# Patient Record
Sex: Male | Born: 1970 | Hispanic: No | Marital: Single | Smoking: Current some day smoker
Health system: Southern US, Community
[De-identification: ages and names within clinical notes are randomized; demographics above are authoritative.]

## PROBLEM LIST (undated history)

## (undated) DIAGNOSIS — I10 Essential (primary) hypertension: Secondary | ICD-10-CM

---

## 2018-09-09 ENCOUNTER — Emergency Department (HOSPITAL_COMMUNITY)
Admission: EM | Admit: 2018-09-09 | Discharge: 2018-09-09 | Disposition: A | Discharge status: Home / Self Care | Payer: Self-pay | Attending: Emergency Medicine | Admitting: Emergency Medicine

## 2018-09-09 ENCOUNTER — Encounter (HOSPITAL_COMMUNITY): Payer: Self-pay

## 2018-09-09 ENCOUNTER — Emergency Department (HOSPITAL_COMMUNITY): Payer: Self-pay

## 2018-09-09 DIAGNOSIS — R0789 Other chest pain: Secondary | ICD-10-CM | POA: Insufficient documentation

## 2018-09-09 DIAGNOSIS — J069 Acute upper respiratory infection, unspecified: Secondary | ICD-10-CM | POA: Insufficient documentation

## 2018-09-09 DIAGNOSIS — B9789 Other viral agents as the cause of diseases classified elsewhere: Secondary | ICD-10-CM | POA: Insufficient documentation

## 2018-09-09 NOTE — ED Notes (Signed)
To x-ray

## 2018-09-09 NOTE — ED Triage Notes (Signed)
Onset several days cough.  Left side of chest painful when takes deep breath.  Also, has nasal congestion.  NAD at triage.

## 2018-09-09 NOTE — ED Notes (Signed)
The pts chest is hurting just started this am  He only has pain when he takes a deep breath no pain at present no distress

## 2018-09-09 NOTE — Discharge Instructions (Signed)
Your symptoms are likely caused by a viral upper respiratory infection. Antibiotics are not helpful in treating viral infection, the virus should run its course in about 5-7 days. Please make sure you are drinking plenty of fluids. You can treat your symptoms supportively with tylenol/ibuprofen for fevers and pains, Zyrtec and Flonase to help with nasal congestion, and over the counter cough syrups and throat lozenges to help with cough. If your symptoms are not improving please follow up with you Primary doctor.   If you develop persistent fevers, shortness of breath or difficulty breathing, chest pain, severe headache and neck pain, persistent nausea and vomiting or other new or concerning symptoms return to the Emergency department.  

## 2018-09-09 NOTE — ED Provider Notes (Signed)
[facility] EMERGENCY [facility] Provider Note   CSN: [id number] Arrival date & time: 09/09/18  1623     History   Chief Complaint Chief Complaint  Patient presents with  . Cough    HPI Jesus Thomas is a 48 y.o. male.  Jesus Thomas is a 48 y.o. male who is otherwise healthy, presents to the emergency department for 3 to 4 days of cough.  He reports cough is occasionally productive of sputum.  He has not had any fevers or chills.  Reports some associated nasal congestion.  No sore throat or ear pain.  Reports this morning he had a brief episode of pain over the left side of his chest along the costochondral margin whenever he took a deep breath.  He reports this pain seemed to resolve on its own and has not recurred since then.  Patient currently chest pain-free reports occasional cough.  He denies any associated shortness of breath. Denies lower extremity pain or swelling, recent travel or immobilization, history of PE or DVT, family or personal history of bleeding or clotting disorders, or hemoptysis.  He took some Aleve and has used a nasal spray intermittently for his symptoms has not tried anything else to treat the symptoms.  No other aggravating or alleviating factors.     History reviewed. No pertinent past medical history.  There are no active problems to display for this patient.   History reviewed. No pertinent surgical history.      Home Medications    Prior to Admission medications   Not on File    Family History History reviewed. No pertinent family history.  Social History Social History   Tobacco Use  . Smoking status: Not on file  Substance Use Topics  . Alcohol use: Not on file  . Drug use: Not on file     Allergies   Patient has no known allergies.   Review of Systems Review of Systems  Constitutional: Negative for chills and fever.  HENT: Positive for congestion, postnasal drip and rhinorrhea. Negative for ear pain  and sore throat.   Eyes: Negative for visual disturbance.  Respiratory: Positive for cough. Negative for chest tightness, shortness of breath and wheezing.   Cardiovascular: Positive for chest pain. Negative for palpitations and leg swelling.  Gastrointestinal: Negative for abdominal pain, nausea and vomiting.  Genitourinary: Negative for dysuria, flank pain and frequency.  Musculoskeletal: Negative for arthralgias and myalgias.  Skin: Negative for color change and rash.  Neurological: Negative for dizziness, syncope and light-headedness.     Physical Exam Updated Vital Signs BP (!) 145/97 (BP Location: Left Arm)   Pulse 74   Temp 98.1 F (36.7 C) (Oral)   Resp 16   SpO2 95%   Physical Exam Vitals signs and nursing note reviewed.  Constitutional:      General: He is not in acute distress.    Appearance: He is well-developed. He is not ill-appearing or diaphoretic.  HENT:     Head: Normocephalic and atraumatic.     Nose: Congestion and rhinorrhea present.     Mouth/Throat:     Mouth: Mucous membranes are moist.     Pharynx: Oropharynx is clear. Posterior oropharyngeal erythema present. No oropharyngeal exudate.  Eyes:     General:        Right eye: No discharge.        Left eye: No discharge.  Neck:     Musculoskeletal: Neck supple.     Comments: No rigidity  Cardiovascular:     Rate and Rhythm: Normal rate and regular rhythm.     Pulses: Normal pulses.     Heart sounds: Normal heart sounds. No murmur. No friction rub. No gallop.   Pulmonary:     Effort: Pulmonary effort is normal. No respiratory distress.     Breath sounds: Normal breath sounds.     Comments: Respirations equal and unlabored, patient able to speak in full sentences, lungs clear to auscultation bilaterally, chest nontender to palpation Chest:     Chest wall: No tenderness.  Abdominal:     General: Abdomen is flat. Bowel sounds are normal. There is no distension.     Palpations: Abdomen is soft. There  is no mass.     Tenderness: There is no abdominal tenderness. There is no guarding.     Comments: Abdomen soft, nondistended, nontender to palpation in all quadrants without guarding or peritoneal signs  Musculoskeletal:        General: No deformity.     Right lower leg: No edema.     Left lower leg: No edema.  Lymphadenopathy:     Cervical: No cervical adenopathy.  Skin:    General: Skin is warm and dry.     Capillary Refill: Capillary refill takes less than 2 seconds.  Neurological:     Mental Status: He is alert and oriented to person, place, and time. Mental status is at baseline.     Coordination: Coordination normal.  Psychiatric:        Mood and Affect: Mood normal.        Behavior: Behavior normal.      ED Treatments / Results  Labs (all labs ordered are listed, but only abnormal results are displayed) Labs Reviewed - No data to display  EKG None  Radiology Dg Chest 2 View  Result Date: 09/09/2018 CLINICAL DATA:  Pleuritic left chest pain. Productive cough and sore throat. Symptoms started this morning. EXAM: CHEST - 2 VIEW COMPARISON:  None. FINDINGS: The heart size and mediastinal contours are within normal limits. Both lungs are clear. The visualized skeletal structures are unremarkable. IMPRESSION: No active cardiopulmonary disease. Electronically Signed   By: Gaylyn Rong M.D.   On: 09/09/2018 20:29    Procedures Procedures (including critical care time)  Medications Ordered in ED Medications - No data to display   Initial Impression / Assessment and Plan / ED Course  I have reviewed the triage vital signs and the nursing notes.  Pertinent labs & imaging results that were available during my care of the patient were reviewed by me and considered in my medical decision making (see chart for details).  Pt presents with nasal congestion and cough, had brief episode of pain over the left costochondral margin this morning with deep breathing which resolved  and patient has had no recurrence of symptoms.  Patient is PERC negative and has no PE risk factors.  Pt is well appearing and vitals are normal. Lungs CTA on exam. Pt CXR negative for acute infiltrate.  No chest tenderness on exam.  Patients symptoms are consistent with URI, likely viral etiology. Discussed that antibiotics are not indicated for viral infections. Pt will be discharged with symptomatic treatment.  Verbalizes understanding and is agreeable with plan. Pt is hemodynamically stable & in NAD prior to dc. Return precautions discussed, pt expresses understanding and agrees with plan.   Final Clinical Impressions(s) / ED Diagnoses   Final diagnoses:  Viral URI with cough  Chest wall pain  ED Discharge Orders    None       Legrand RamsFord, Edan Serratore N, PA-C 09/09/18 2112    Maia PlanLong, Joshua G, MD 09/10/18 [phone]

## 2018-11-26 ENCOUNTER — Encounter (HOSPITAL_COMMUNITY): Payer: Self-pay | Admitting: Emergency Medicine

## 2018-11-26 ENCOUNTER — Emergency Department (HOSPITAL_COMMUNITY)
Admission: EM | Admit: 2018-11-26 | Discharge: 2018-11-26 | Disposition: A | Discharge status: Home / Self Care | Payer: Self-pay | Attending: Emergency Medicine | Admitting: Emergency Medicine

## 2018-11-26 ENCOUNTER — Other Ambulatory Visit: Payer: Self-pay

## 2018-11-26 DIAGNOSIS — H00014 Hordeolum externum left upper eyelid: Secondary | ICD-10-CM | POA: Insufficient documentation

## 2018-11-26 DIAGNOSIS — F17200 Nicotine dependence, unspecified, uncomplicated: Secondary | ICD-10-CM | POA: Insufficient documentation

## 2018-11-26 DIAGNOSIS — I10 Essential (primary) hypertension: Secondary | ICD-10-CM | POA: Insufficient documentation

## 2018-11-26 HISTORY — DX: Essential (primary) hypertension: I10

## 2018-11-26 NOTE — ED Provider Notes (Signed)
[facility] EMERGENCY DEPARTMENT Provider Note   CSN: [id number] Arrival date & time: 11/26/18  0631    History   Chief Complaint Chief Complaint  Patient presents with  . Eye Pain    HPI Jesus Thomas is a 48 y.o. male.     HPI   Pt is a 48 y/o male with a h/o HTN who presents to the ED today c/o eye irritation that began 6 days ago. States his upper eyelid became somewhat swollen, red, and irritated a few days ago. HE has had some eye drainage intermittently since this started. No significant eye pain. No vision changes or headaches. Does not wear contacts. Has tried washing eye with soap and water without relief.   Past Medical History:  Diagnosis Date  . Hypertension     There are no active problems to display for this patient.   History reviewed. No pertinent surgical history.      Home Medications    Prior to Admission medications   Not on File    Family History History reviewed. No pertinent family history.  Social History Social History   Tobacco Use  . Smoking status: Current Some Day Smoker  . Smokeless tobacco: Never Used  Substance Use Topics  . Alcohol use: Not on file  . Drug use: Not on file     Allergies   Patient has no known allergies.   Review of Systems Review of Systems  Constitutional: Negative for fever.  Eyes: Positive for discharge. Negative for photophobia, redness and itching.       Redness/irritation to the left upper eyelid  Gastrointestinal: Negative for nausea and vomiting.  Musculoskeletal: Negative for back pain.  Neurological: Negative for headaches.     Physical Exam Updated Vital Signs BP (!) 166/107 (BP Location: Left Arm)   Pulse 66   Temp (!) 97.4 F (36.3 C) (Oral)   Resp 16   Ht 6' (1.829 m)   Wt 102.1 kg   SpO2 99%   BMI 30.52 kg/m   Physical Exam Constitutional:      General: He is not in acute distress.    Appearance: He is well-developed.  HENT:     Nose: Nose  normal.  Eyes:     General:        Right eye: No discharge.        Left eye: No discharge.     Extraocular Movements: Extraocular movements intact.     Conjunctiva/sclera: Conjunctivae normal.     Pupils: Pupils are equal, round, and reactive to light.     Comments: No pain with EOM. Small amount of erythema and swelling to the left upper eyelid with hordeolum noted to medial aspect of upper eyelid. No swelling/erythema to lower lid. No induration of the eyelid.   Cardiovascular:     Rate and Rhythm: Normal rate.  Pulmonary:     Effort: Pulmonary effort is normal.  Skin:    General: Skin is warm and dry.  Neurological:     Mental Status: He is alert and oriented to person, place, and time.     ED Treatments / Results  Labs (all labs ordered are listed, but only abnormal results are displayed) Labs Reviewed - No data to display  EKG None  Radiology No results found.  Procedures Procedures (including critical care time)  Medications Ordered in ED Medications - No data to display   Initial Impression / Assessment and Plan / ED Course  I have  reviewed the triage vital signs and the nursing notes.  Pertinent labs & imaging results that were available during my care of the patient were reviewed by me and considered in my medical decision making (see chart for details).      Final Clinical Impressions(s) / ED Diagnoses   Final diagnoses:  Hordeolum externum of left upper eyelid   Physical exam with left upper lid erythema, tenderness and mild swelling without injection of the cornea or conjunctiva, consistent with external hordeolum.  No purulent discharge, entrapment, consensual photophobia, or dendritic staining with fluorescein study to either eye.  Doubt corneal abrasion as no discomfort to the eye itself. No induration of the eyelid to suggest periorbital cellulitis. Presentation non-concerning for iritis, bacterial conjunctivitis, corneal abrasions, or HSV.     discussed use of warm compresses.   Patient advised to followup with ophthalmologist if symptoms persist or worsen in any way including vision change or purulent discharge.    It has been determined that no acute conditions requiring further emergency intervention are present at this time. The patient/guardian have been advised of the diagnosis and plan. We have discussed signs and symptoms that warrant return to the ED, such as changes or worsening in symptoms.   Vital signs are stable at discharge.   BP (!) 166/107 (BP Location: Left Arm)   Pulse 66   Temp (!) 97.4 F (36.3 C) (Oral)   Resp 16   Ht 6' (1.829 m)   Wt 102.1 kg   SpO2 99%   BMI 30.52 kg/m   Patient/guardian has voiced understanding and agreed to follow-up with the PCP or specialist.  ED Discharge Orders    None       Karrie Meres, PA-C 11/26/18 5498    Geoffery Lyons, MD 11/26/18 2317

## 2018-11-26 NOTE — ED Notes (Signed)
ED Provider at bedside. 

## 2018-11-26 NOTE — ED Notes (Signed)
Patient verbalizes understanding of discharge instructions . Opportunity for questions and answers were provided . Armband removed by staff ,Pt discharged from ED. W/C  offered at D/C  and Declined W/C at D/C and was escorted to lobby by RN.  

## 2018-11-26 NOTE — ED Triage Notes (Signed)
Pt c/o eye infection with swelling and purulent drainage, beginning earlier this week. Pt denies changes in vision. Hasn't taken anything for pain or infection.

## 2018-11-26 NOTE — Discharge Instructions (Addendum)
Please use warm compresses on the eye multiple times per day.   Monitor your symptoms closely. If you experience worsening pain or swelling to the eye, fevers, vision changes, or any new or worsening symptoms then you need to return tot he emergency room immediately.

## 2019-05-22 ENCOUNTER — Encounter (HOSPITAL_COMMUNITY): Payer: Self-pay | Admitting: *Deleted

## 2019-05-22 ENCOUNTER — Emergency Department (HOSPITAL_COMMUNITY): Payer: Self-pay

## 2019-05-22 ENCOUNTER — Emergency Department (HOSPITAL_COMMUNITY)
Admission: EM | Admit: 2019-05-22 | Discharge: 2019-05-23 | Disposition: A | Discharge status: Home / Self Care | Payer: Self-pay | Attending: Emergency Medicine | Admitting: Emergency Medicine

## 2019-05-22 DIAGNOSIS — F17210 Nicotine dependence, cigarettes, uncomplicated: Secondary | ICD-10-CM | POA: Insufficient documentation

## 2019-05-22 DIAGNOSIS — K0889 Other specified disorders of teeth and supporting structures: Secondary | ICD-10-CM | POA: Insufficient documentation

## 2019-05-22 DIAGNOSIS — R059 Cough, unspecified: Secondary | ICD-10-CM

## 2019-05-22 DIAGNOSIS — R05 Cough: Secondary | ICD-10-CM | POA: Insufficient documentation

## 2019-05-22 DIAGNOSIS — I10 Essential (primary) hypertension: Secondary | ICD-10-CM | POA: Insufficient documentation

## 2019-05-22 LAB — BASIC METABOLIC PANEL
Anion gap: 14 (ref 5–15)
BUN: 9 mg/dL (ref 6–20)
CO2: 20 mmol/L — ABNORMAL LOW (ref 22–32)
Calcium: 8.9 mg/dL (ref 8.9–10.3)
Chloride: 102 mmol/L (ref 98–111)
Creatinine, Ser: 1.12 mg/dL (ref 0.61–1.24)
GFR calc Af Amer: >60 mL/min (ref >60)
GFR calc non Af Amer: >60 mL/min (ref >60)
Glucose, Bld: 168 mg/dL — ABNORMAL HIGH (ref 70–99)
Potassium: 3.8 mmol/L (ref 3.5–5.1)
Sodium: 136 mmol/L (ref 135–145)

## 2019-05-22 LAB — CBC
HCT: 46.9 % (ref 39.0–52.0)
Hemoglobin: 15.1 g/dL (ref 13.0–17.0)
MCH: 28.4 pg (ref 26.0–34.0)
MCHC: 32.2 g/dL (ref 30.0–36.0)
MCV: 88.2 fL (ref 80.0–100.0)
Platelets: 419 10*3/uL — ABNORMAL HIGH (ref 150–400)
RBC: 5.32 MIL/uL (ref 4.22–5.81)
RDW: 14.4 % (ref 11.5–15.5)
WBC: 10 10*3/uL (ref 4.0–10.5)
nRBC: 0 % (ref 0.0–0.2)

## 2019-05-22 LAB — TROPONIN I (HIGH SENSITIVITY)
Troponin I (High Sensitivity): 9 ng/L (ref <18)
Troponin I (High Sensitivity): 9 ng/L (ref <18)

## 2019-05-22 MED ORDER — SODIUM CHLORIDE 0.9% FLUSH: 3.0000 mL | Freq: Once | INTRAVENOUS | Status: DC

## 2019-05-22 NOTE — ED Triage Notes (Signed)
Pt in c/o mid cp that radiates to mid back with coughing only, reports SOB earlier today that since has subsided, c/o dizziness onset at 12 pm today, denies n/v/d, A&O x4

## 2019-05-22 NOTE — ED Notes (Signed)
Pt stated he is feeling very dizzy. Even while sitting or laying down he is feeling dizzy. Pt currently laying down on waiting room bench

## 2019-05-23 ENCOUNTER — Other Ambulatory Visit: Payer: Self-pay

## 2019-05-23 MED ORDER — BENZONATATE 100 MG PO CAPS: 100.0000 mg | ORAL_CAPSULE | Freq: Three times a day (TID) | ORAL | 0 refills | Status: AC

## 2019-05-23 MED ORDER — LISINOPRIL 10 MG PO TABS: 10.0000 mg | ORAL_TABLET | Freq: Every day | ORAL | 1 refills | Status: AC

## 2019-05-23 MED ORDER — LIDOCAINE VISCOUS HCL 2 % MT SOLN: 15.0000 mL | OROMUCOSAL | 2 refills | Status: AC | PRN

## 2019-05-23 MED ORDER — PREDNISONE 10 MG (21) PO TBPK: ORAL_TABLET | ORAL | 0 refills | Status: AC

## 2019-05-23 NOTE — Discharge Instructions (Addendum)
Overall, your work-up here is reassuring.  There were some nonspecific T wave abnormalities on the EKG.  Follow-up with cardiology or a primary care provider on this matter. Begin taking the lisinopril again.  You will need to follow-up with a primary care provider for any further management of your blood pressure.  Tessalon: This medication may be used, as needed, for cough.  Prednisone: Take the prednisone, as prescribed, until finished. If you are a diabetic, please know prednisone can raise your blood sugar temporarily.   Dental Pain You have been seen today for dental pain. You should follow up with a dentist as soon as possible. This problem will not resolve on its own without the care of a dentist.  Lidocaine liquid: Use the viscous lidocaine for mouth pain. Swish with the lidocaine and spit it out. Do not swallow it. Salt water solution: You should also swish with a homemade salt water solution, twice a day.  Make this solution by mixing 8 ounces of warm water with about half a teaspoon of salt. Antiinflammatory medications: Take 600 mg of ibuprofen every 6 hours or 440 mg (over the counter dose) to 500 mg (prescription dose) of naproxen every 12 hours for the next 3 days. After this time, these medications may be used as needed for pain. Take these medications with food to avoid upset stomach. Choose only one of these medications, do not take them together. Acetaminophen (generic for Tylenol): Should you continue to have additional pain while taking the ibuprofen or naproxen, you may add in acetaminophen as needed. Your daily total maximum amount of acetaminophen from all sources should be limited to 4000mg /day for persons without liver problems, or 2000mg /day for those with liver problems.  For prescription assistance, may try using prescription discount sites or apps, such as goodrx.[place], [address] [place], [place] [address] [phone]  [place], [place] [address] [phone]  [facility] [address] [place], [place] [address] [phone] ext. [place] [address], [place] [address] [place], [place] [address] [phone]  [facility] [address] [place], [place] [address] [phone]  [facility] [employer] [email or web address]  [place] [address] [place], [place] [address] [phone]  Website for free, low-income, or sliding scale dental services in : www.freedentalcare.[place]  To find a dentist in [place] and surrounding areas: CardCollectible.com.ee  [facility] [email or web address]  [facility] Medicaid Dentist [email or web address]

## 2019-05-23 NOTE — ED Provider Notes (Signed)
[facility] EMERGENCY DEPARTMENT Provider Note   CSN: [id number] Arrival date & time: 05/22/19  1337     History   Chief Complaint Chief Complaint  Patient presents with  . Chest Pain    HPI Jesus Thomas is a 48 y.o. male.     HPI   Jesus Thomas is a 48 y.o. male, with a history of HTN, presenting to the ED with chest pain.  Patient states he has had instances of central chest soreness and sharp pain with coughing, sometimes also present in the upper back with coughing, moderate, nonradiating from these locations.  This is been occurring intermittently for about the past week.  He had an instance of shortness of breath this morning, but this resolved and did not recur. Patient denies contact with known or suspected COVID-19 patients. Also complains of some right lower dental pain.  States he broke a tooth in this region several years ago and has had increased sensitivity and soreness over the past several months.  Denies fever, facial swelling, vomiting, throat pain/swelling.  Denies diaphoresis, exertional chest pain, exertional shortness of breath, abdominal pain, or any other complaints.   Past Medical History:  Diagnosis Date  . Hypertension     There are no active problems to display for this patient.   History reviewed. No pertinent surgical history.      Home Medications    Prior to Admission medications   Medication Sig Start Date End Date Taking? Authorizing Provider  benzonatate (TESSALON) 100 MG capsule Take 1 capsule (100 mg total) by mouth every 8 (eight) hours. 05/23/19   Antigone Crowell C, PA-C  lidocaine (XYLOCAINE) 2 % solution Use as directed 15 mLs in the mouth or throat as needed for mouth pain. 05/23/19   Aracelys Glade C, PA-C  lisinopril (ZESTRIL) 10 MG tablet Take 1 tablet (10 mg total) by mouth daily. 05/23/19 07/22/19  Raesha Coonrod C, PA-C  predniSONE (STERAPRED UNI-PAK 21 TAB) 10 MG (21) TBPK tablet Take 6 tabs (60mg ) day 1, 5 tabs  (50mg ) day 2, 4 tabs (40mg ) day 3, 3 tabs (30mg ) day 4, 2 tabs (20mg ) day 5, and 1 tab (10mg ) day 6. 05/23/19   Camiah Humm, Helane Gunther, PA-C    Family History No family history on file.  Social History Social History   Tobacco Use  . Smoking status: Current Some Day Smoker    Types: Cigarettes  . Smokeless tobacco: Never Used  Substance Use Topics  . Alcohol use: Not Currently  . Drug use: Yes    Types: Marijuana     Allergies   Patient has no known allergies.   Review of Systems Review of Systems  Constitutional: Negative for chills, diaphoresis and fever.  HENT: Positive for dental problem. Negative for drooling, facial swelling, trouble swallowing and voice change.   Respiratory: Positive for cough and shortness of breath (resolved).   Cardiovascular: Positive for chest pain (with coughing).  Gastrointestinal: Negative for abdominal pain, diarrhea, nausea and vomiting.  Neurological: Negative for dizziness, syncope, weakness, numbness and headaches.  All other systems reviewed and are negative.    Physical Exam Updated Vital Signs BP (!) 185/115 (BP Location: Right Arm)   Pulse 77   Temp 97.8 F (36.6 C) (Oral)   Resp 16   SpO2 98%   Physical Exam Vitals signs and nursing note reviewed.  Constitutional:      General: He is not in acute distress.    Appearance: He is well-developed. He  is not diaphoretic.  HENT:     Head: Normocephalic and atraumatic.     Mouth/Throat:     Mouth: Mucous membranes are moist.     Pharynx: Oropharynx is clear.     Comments: Erosion to two right rearmost mandibular molars down to the gumline.  No noted pulp exposure.  Minimal tenderness. Dentition appears to be stable.  No noted area of swelling or fluctuance.  No trismus or noted abnormal phonation.  Mouth opening to at least 3 finger widths.  Handles oral secretions without difficulty.  No noted facial swelling.  No sublingual swelling.  No swelling or tenderness to the submental or  submandibular regions.  No swelling or tenderness into the soft tissues of the neck. Eyes:     Conjunctiva/sclera: Conjunctivae normal.  Neck:     Musculoskeletal: Neck supple.  Cardiovascular:     Rate and Rhythm: Normal rate and regular rhythm.     Pulses: Normal pulses.          Radial pulses are 2+ on the right side and 2+ on the left side.       Posterior tibial pulses are 2+ on the right side and 2+ on the left side.     Heart sounds: Normal heart sounds.     Comments: Tactile temperature in the extremities appropriate and equal bilaterally. Pulmonary:     Effort: Pulmonary effort is normal. No respiratory distress.     Breath sounds: Normal breath sounds.  Chest:     Chest wall: No tenderness.  Abdominal:     Palpations: Abdomen is soft.     Tenderness: There is no abdominal tenderness. There is no guarding.  Musculoskeletal:     Right lower leg: No edema.     Left lower leg: No edema.  Lymphadenopathy:     Cervical: No cervical adenopathy.  Skin:    General: Skin is warm and dry.  Neurological:     Mental Status: He is alert.     Comments: Sensation grossly intact to light touch in the extremities.  Grip strengths equal bilaterally.  Strength 5/5 in all extremities. No gait disturbance. Coordination intact. Cranial nerves III-XII grossly intact. No facial droop.   Psychiatric:        Mood and Affect: Mood and affect normal.        Speech: Speech normal.        Behavior: Behavior normal.      ED Treatments / Results  Labs (all labs ordered are listed, but only abnormal results are displayed) Labs Reviewed  BASIC METABOLIC PANEL - Abnormal; Notable for the following components:      Result Value   CO2 20 (*)    Glucose, Bld 168 (*)    All other components within normal limits  CBC - Abnormal; Notable for the following components:   Platelets 419 (*)    All other components within normal limits  TROPONIN I (HIGH SENSITIVITY)  TROPONIN I (HIGH SENSITIVITY)     EKG EKG Interpretation  Date/Time:  Tuesday May 22 2019 13:51:38 EDT Ventricular Rate:  81 PR Interval:  114 QRS Duration: 74 QT Interval:  368 QTC Calculation: 427 R Axis:   46 Text Interpretation:  Normal sinus rhythm Nonspecific T wave abnormality Abnormal ECG No old tracing to compare Confirmed by Melene PlanFloyd, Dan [phone]([id number]) on 05/23/2019 2:57:36 AM   Radiology Dg Chest 2 View  Result Date: 05/22/2019 CLINICAL DATA:  48 year old with acute onset chest pain and shortness of breath.  Current history of hypertension. EXAM: CHEST - 2 VIEW COMPARISON:  09/09/2018. FINDINGS: Cardiomediastinal silhouette unremarkable and unchanged. Lungs clear. Bronchovascular markings normal. Pulmonary vascularity normal. No visible pleural effusions. No pneumothorax. Mild degenerative changes involving the thoracic and visualized lumbar spine. No interval change. IMPRESSION: 1. No acute cardiopulmonary disease. 2. Stable examination. Electronically Signed   By: Hulan Saas M.D.   On: 05/22/2019 14:38    Procedures Procedures (including critical care time)  Medications Ordered in ED Medications  sodium chloride flush (NS) 0.9 % injection 3 mL (has no administration in time range)     Initial Impression / Assessment and Plan / ED Course  I have reviewed the triage vital signs and the nursing notes.  Pertinent labs & imaging results that were available during my care of the patient were reviewed by me and considered in my medical decision making (see chart for details).        Patient presents with chest pain with coughing.  Low suspicion for ACS. HEART score is 3, indicating low risk for a cardiac event.  EKG without evidence of acute ischemia or pathologic/symptomatic arrhythmia. There were nonspecific T wave abnormalities noted on the EKG with no previous EKG with which to compare.   Wells criteria score is 0, indicating low risk for PE.   Dissection was considered, but thought less likely base  on: History and description of the pain are not suggestive, patient is not ill-appearing, lack of risk factors, equal bilateral pulses, lack of neurologic deficits, no widened mediastinum on chest x-ray. No evidence of other acute abnormality on chest x-ray. Delta troponins negative. Patient will follow-up with cardiology, as needed, for continued discomfort.  We discussed the patient's hypertension.  He states he was previously on lisinopril, but has not taken it "for a while" because he does not have a PCP and ran out of refills.  He was advised to follow-up with a PCP on this matter.  Resources given.  The patient was given instructions for home care as well as return precautions. Patient voices understanding of these instructions, accepts the plan, and is comfortable with discharge.  Final Clinical Impressions(s) / ED Diagnoses   Final diagnoses:  Cough  Pain, dental    ED Discharge Orders         Ordered    lisinopril (ZESTRIL) 10 MG tablet  Daily     05/23/19 0155    lidocaine (XYLOCAINE) 2 % solution  As needed     05/23/19 0155    benzonatate (TESSALON) 100 MG capsule  Every 8 hours     05/23/19 0200    predniSONE (STERAPRED UNI-PAK 21 TAB) 10 MG (21) TBPK tablet     05/23/19 0201           Anselm Pancoast, PA-C 05/23/19 0302    Melene Plan, DO 05/23/19 [phone]

## 2019-10-19 ENCOUNTER — Emergency Department (HOSPITAL_COMMUNITY): Payer: Self-pay

## 2019-10-19 ENCOUNTER — Encounter (HOSPITAL_COMMUNITY): Payer: Self-pay | Admitting: Emergency Medicine

## 2019-10-19 ENCOUNTER — Emergency Department (HOSPITAL_COMMUNITY)
Admission: EM | Admit: 2019-10-19 | Discharge: 2019-10-19 | Disposition: A | Discharge status: Home / Self Care | Payer: Self-pay | Attending: Emergency Medicine | Admitting: Emergency Medicine

## 2019-10-19 ENCOUNTER — Other Ambulatory Visit: Payer: Self-pay

## 2019-10-19 DIAGNOSIS — Z113 Encounter for screening for infections with a predominantly sexual mode of transmission: Secondary | ICD-10-CM

## 2019-10-19 DIAGNOSIS — F17210 Nicotine dependence, cigarettes, uncomplicated: Secondary | ICD-10-CM | POA: Insufficient documentation

## 2019-10-19 DIAGNOSIS — N50811 Right testicular pain: Secondary | ICD-10-CM | POA: Insufficient documentation

## 2019-10-19 DIAGNOSIS — Z202 Contact with and (suspected) exposure to infections with a predominantly sexual mode of transmission: Secondary | ICD-10-CM | POA: Insufficient documentation

## 2019-10-19 DIAGNOSIS — I10 Essential (primary) hypertension: Secondary | ICD-10-CM | POA: Insufficient documentation

## 2019-10-19 DIAGNOSIS — R030 Elevated blood-pressure reading, without diagnosis of hypertension: Secondary | ICD-10-CM

## 2019-10-19 LAB — URINALYSIS, ROUTINE W REFLEX MICROSCOPIC
Bilirubin Urine: NEGATIVE
Glucose, UA: NEGATIVE mg/dL
Ketones, ur: NEGATIVE mg/dL
Leukocytes,Ua: NEGATIVE
Nitrite: NEGATIVE
Protein, ur: 30 mg/dL — AB
Specific Gravity, Urine: 1.017 (ref 1.005–1.030)
pH: 5 (ref 5.0–8.0)

## 2019-10-19 NOTE — Discharge Instructions (Addendum)
As discussed, your urine did not show any signs of infection. You gonorrhea and chlamydia tests are pending. You will be called if your results are positive. I have included the number of [facility]. Call to schedule an appointment to have your skin tag removed. Your blood pressure was elevated here in the ED follow-up with [facility] for further evaluation. You may take over the counter ibuprofen or tylenol as needed for pain. I have included the number of the urologist. I want you to follow-up with the urologist for further evaluation of your testicular pain. Your ultrasound showed calcification in both your testes. Return to the ER for new or worsening symptoms.

## 2019-10-19 NOTE — ED Notes (Signed)
We spoke about high BP and follow up with a PHP

## 2019-10-19 NOTE — ED Provider Notes (Signed)
[facility] EMERGENCY DEPARTMENT Provider Note   CSN: [id number] Arrival date & time: 10/19/19  1903     History Chief Complaint  Patient presents with  . Groin Pain    Jesus Thomas is a 49 y.o. male with a past medical history significant for hypertension who presents to the ED due to gradual onset of worsening right testicular pain x3 weeks.  Patient states it feels like "his testicle is twisted".  Patient states the pain was intermittent at first but has become more constant over the past few days.  He rates his pain a 6/10.  No pain treatment prior to arrival.  Patient also states he has a history of gonorrhea and feels a weird sensation in his penis which he believes could be gonorrhea.  He is currently sexually active without protection with one male partner.  Patient is also concerned about a "mole" on his inner right leg below his testicle that has been there for the past 6 months.  Patient denies urinary symptoms.  Patient denies penile discharge, penile swelling, difficulties with bowel movements, fever, and chills.  Patient denies abdominal pain.    Past Medical History:  Diagnosis Date  . Hypertension     There are no problems to display for this patient.   History reviewed. No pertinent surgical history.     No family history on file.  Social History   Tobacco Use  . Smoking status: Current Some Day Smoker    Types: Cigarettes  . Smokeless tobacco: Never Used  Substance Use Topics  . Alcohol use: Not Currently  . Drug use: Yes    Types: Marijuana    Home Medications Prior to Admission medications   Medication Sig Start Date End Date Taking? Authorizing Provider  benzonatate (TESSALON) 100 MG capsule Take 1 capsule (100 mg total) by mouth every 8 (eight) hours. 05/23/19   Joy, Shawn C, PA-C  lidocaine (XYLOCAINE) 2 % solution Use as directed 15 mLs in the mouth or throat as needed for mouth pain. 05/23/19   Joy, Shawn C, PA-C  lisinopril  (ZESTRIL) 10 MG tablet Take 1 tablet (10 mg total) by mouth daily. 05/23/19 07/22/19  Joy, Shawn C, PA-C  predniSONE (STERAPRED UNI-PAK 21 TAB) 10 MG (21) TBPK tablet Take 6 tabs (60mg ) day 1, 5 tabs (50mg ) day 2, 4 tabs (40mg ) day 3, 3 tabs (30mg ) day 4, 2 tabs (20mg ) day 5, and 1 tab (10mg ) day 6. 05/23/19   Joy, Shawn C, PA-C    Allergies    Patient has no known allergies.  Review of Systems   Review of Systems  Constitutional: Negative for chills and fever.  Gastrointestinal: Negative for abdominal pain, diarrhea, nausea and vomiting.  Genitourinary: Positive for testicular pain. Negative for difficulty urinating, discharge, dysuria, penile pain, penile swelling and scrotal swelling.  All other systems reviewed and are negative.   Physical Exam Updated Vital Signs BP (!) 164/101   Pulse 89   Temp 98.3 F (36.8 C) (Oral)   Resp 18   Ht 6' (1.829 m)   Wt 99.8 kg   SpO2 98%   BMI 29.84 kg/m   Physical Exam Vitals and nursing note reviewed. Exam conducted with a chaperone present.  Constitutional:      General: He is not in acute distress.    Appearance: He is not ill-appearing.  HENT:     Head: Normocephalic.  Eyes:     Conjunctiva/sclera: Conjunctivae normal.  Cardiovascular:  Rate and Rhythm: Normal rate and regular rhythm.     Pulses: Normal pulses.     Heart sounds: Normal heart sounds. No murmur. No friction rub. No gallop.   Pulmonary:     Effort: Pulmonary effort is normal.     Breath sounds: Normal breath sounds.  Abdominal:     General: Abdomen is flat. There is no distension.     Palpations: Abdomen is soft.     Tenderness: There is no abdominal tenderness. There is no guarding or rebound.  Genitourinary:    Penis: Uncircumcised. No phimosis, paraphimosis, tenderness, discharge or lesions.      Testes: Normal.        Right: Mass or tenderness not present.        Left: Mass or tenderness not present.     Epididymis:     Right: Normal.     Left:  Normal.     Comments: Normal during uncircumcised penis with no lesions, discharge, or edema.  Normal bilateral testes with no tenderness palpation or masses appreciated.  Small pink skin tag located below scrotal sack.  Musculoskeletal:     Cervical back: Neck supple.     Comments: Able to move all 4 extremities without difficulty.  Skin:    General: Skin is warm and dry.  Neurological:     General: No focal deficit present.     Mental Status: He is alert.  Psychiatric:        Mood and Affect: Mood normal.        Behavior: Behavior normal.     ED Results / Procedures / Treatments   Labs (all labs ordered are listed, but only abnormal results are displayed) Labs Reviewed  URINALYSIS, ROUTINE W REFLEX MICROSCOPIC - Abnormal; Notable for the following components:      Result Value   APPearance HAZY (*)    Hgb urine dipstick SMALL (*)    Protein, ur 30 (*)    Bacteria, UA RARE (*)    All other components within normal limits  GC/CHLAMYDIA PROBE AMP ([place]) NOT AT [facility]    EKG None  Radiology US SCROTUM W/DOPPLER  Result Date: 10/19/2019 CLINICAL DATA:  Right inguinal pain for 3 weeks EXAM: SCROTAL ULTRASOUND DOPPLER ULTRASOUND OF THE TESTICLES TECHNIQUE: Complete ultrasound examination of the testicles, epididymis, and other scrotal structures was performed. Color and spectral Doppler ultrasound were also utilized to evaluate blood flow to the testicles. COMPARISON:  None. FINDINGS: Right testicle Measurements: 2.7 x 4.7 x 2.1 cm. No testicular mass. There are scattered punctate calcifications primarily along the periphery of this testicle nonspecific. Left testicle Measurements: 2.7 x 4.5 x 2.2 cm. No testicular mass. There is scattered punctate calcifications primarily along the periphery of this testicle, nonspecific. Right epididymis:  Normal in size and appearance. Left epididymis: Normal in size and appearance. 3 mm simple epididymal cyst is identified, of doubtful  significance. Hydrocele:  None visualized. Varicocele:  None visualized. Pulsed Doppler interrogation of both testes demonstrates normal low resistance arterial and venous waveforms bilaterally. IMPRESSION: 1. No evidence of testicular torsion. 2. Punctate calcifications primarily in the subcapsular regions of the bilateral testicles, nonspecific. This is not the usual distribution for testicular microlithiasis, it may be related to prior inflammation or infection. Electronically Signed   By: Randa Ngo M.D.   On: 10/19/2019 22:40    Procedures Procedures (including critical care time)  Medications Ordered in ED Medications - No data to display  ED Course  I have  reviewed the triage vital signs and the nursing notes.  Pertinent labs & imaging results that were available during my care of the patient were reviewed by me and considered in my medical decision making (see chart for details).    MDM Rules/Calculators/A&P                     49 year old male presents to the ED with multiple complaints.  Patient states he has been having right testicular pain x3 weeks which he states feels like it is twisted.  Patient also is concerned about possible gonorrhea infection.  Stable vitals. Per triage, patient was tachycardic upon arrival at 108, but had a normal HR of 88 during my initial evaluation.  Abdomen soft, nondistended, nontender.  GU exam performed with chaperone in room.  No tenderness to palpation over bilateral testicles.  Normal uncircumcised penis with no lesions or discharge.  Will obtain ultrasound to rule out testicular torsion, and epididymitis.  UA personally reviewed which is negative for signs of infection.  Gonorrhea and Chlamydia cultures pending. [place] personally reviewed which demonstrates: 1. No evidence of testicular torsion.  2. Punctate calcifications primarily in the subcapsular regions of  the bilateral testicles, nonspecific. This is not the usual  distribution for  testicular microlithiasis, it may be related to  prior inflammation or infection.   No concern for testicular torsion or epididymitis. Will discharge patient with urology follow-up. Instructed patient to take over the counter ibuprofen or tylenol as needed for pain. [facility] number given to patient to establish care. Advised patient to have BP checked given elevated blood pressure readings here in the ED. No signs of hypertensive urgency/emergency. Strict ED precautions discussed with patient. Patient states understanding and agrees to plan. Patient discharged home in no acute distress and stable vitals  Final Clinical Impression(s) / ED Diagnoses Final diagnoses:  Right testicular pain  Screening for STD (sexually transmitted disease)  Elevated blood pressure reading    Rx / DC Orders ED Discharge Orders    None       Jesusita Oka 10/19/19 2305    Geoffery Lyons, MD 10/21/19 1501

## 2019-10-19 NOTE — ED Triage Notes (Signed)
Pt c/o groin pain x 3 weeks, denies any injury no urinary symptoms.

## 2019-10-19 NOTE — ED Notes (Signed)
Checked on Pt to resases BP. Rechecked BP and obtain similar reading. Pt states that he has high blood pressure. Pt's 88 HR and 99% room air. BP 164/101. Pt is alert and oriented and denies any pain.

## 2019-10-23 LAB — GC/CHLAMYDIA PROBE AMP (~~LOC~~) NOT AT ARMC
Chlamydia: NEGATIVE
Neisseria Gonorrhea: NEGATIVE

## 2020-06-18 IMAGING — US US SCROTUM W/ DOPPLER COMPLETE
1 series · 13 of 25 positions shown · non-contrast
Comparison: None.

CLINICAL DATA: Right inguinal pain for 3 weeks

EXAM:
SCROTAL ULTRASOUND
DOPPLER ULTRASOUND OF THE TESTICLES
TECHNIQUE: Complete ultrasound examination of the testicles, epididymis, and
other scrotal structures was performed. Color and spectral Doppler
ultrasound were also utilized to evaluate blood flow to the
testicles.

[Series 1: us scrotum w/ doppler complete · 13 of 54 slices shown]
[im 1/54]
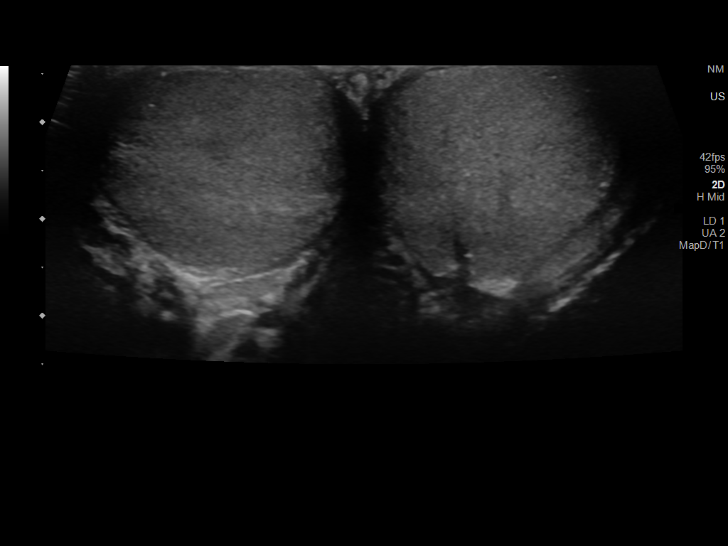
[im 5/54]
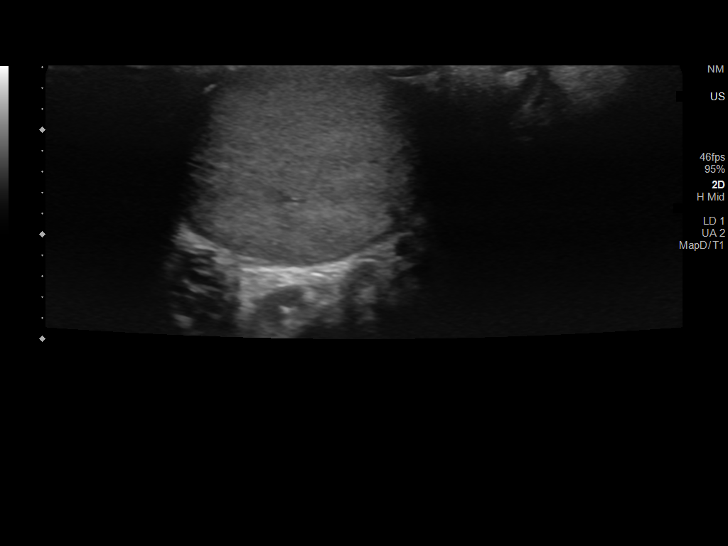
[im 9/54]
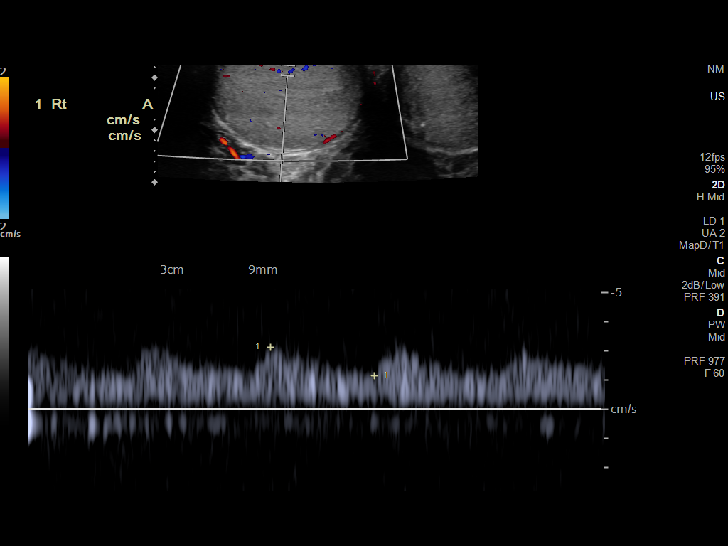
[im 14/54]
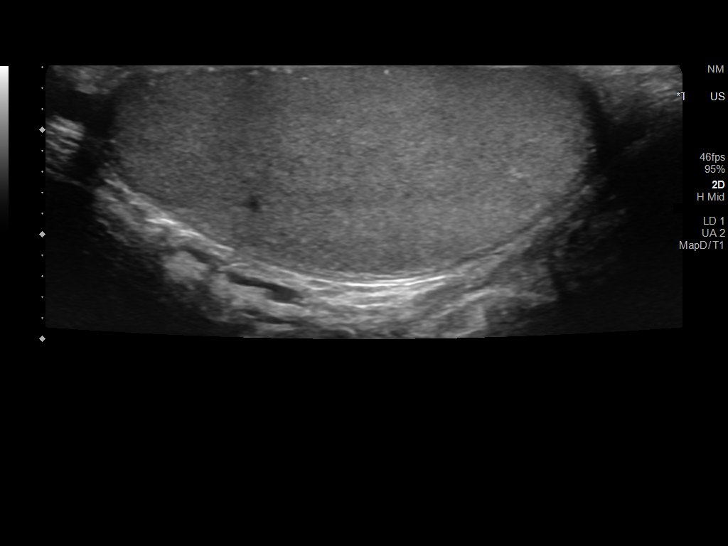
[im 18/54]
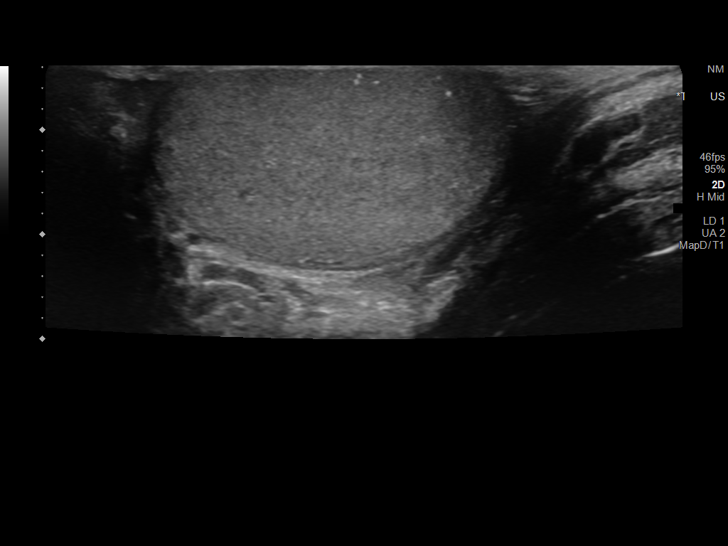
[im 23/54]
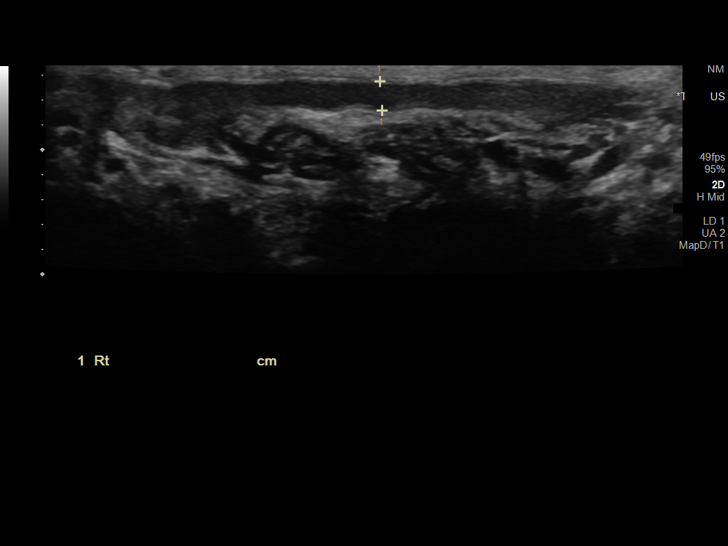
[im 27/54]
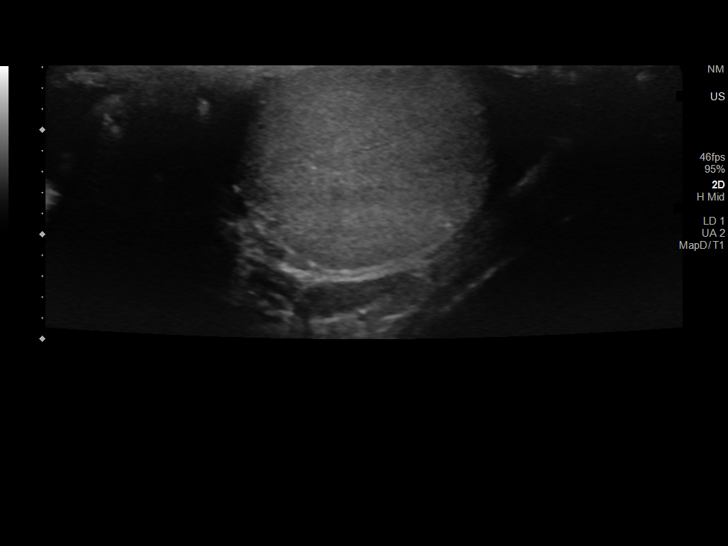
[im 31/54]
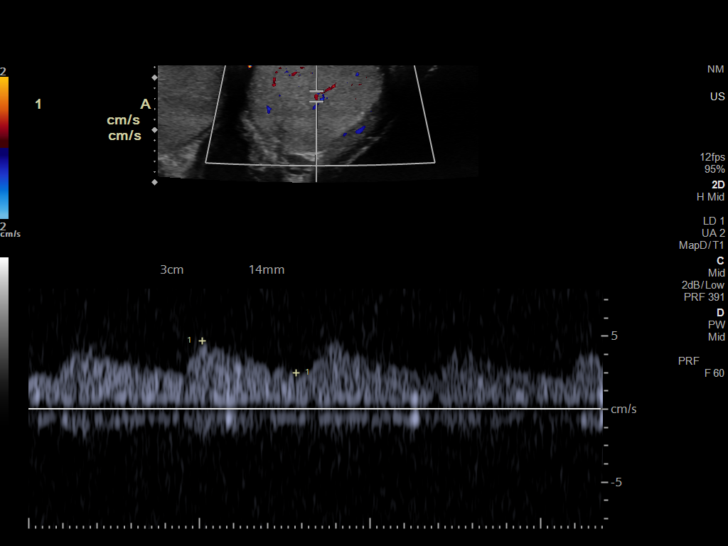
[im 36/54]
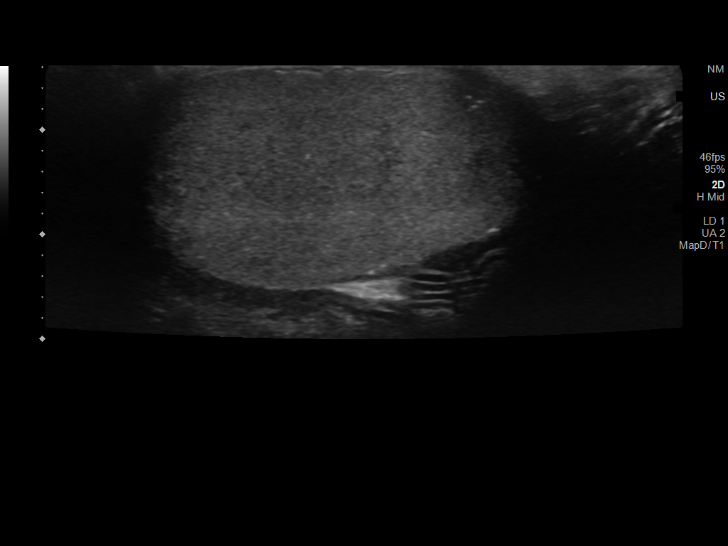
[im 40/54]
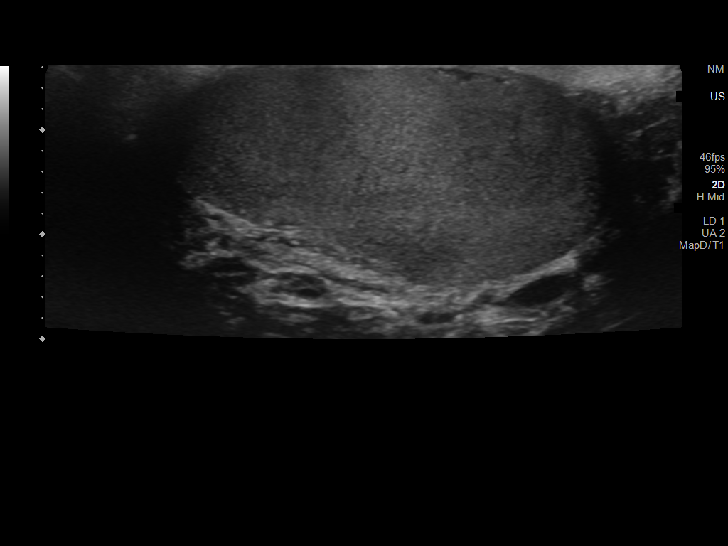
[im 45/54]
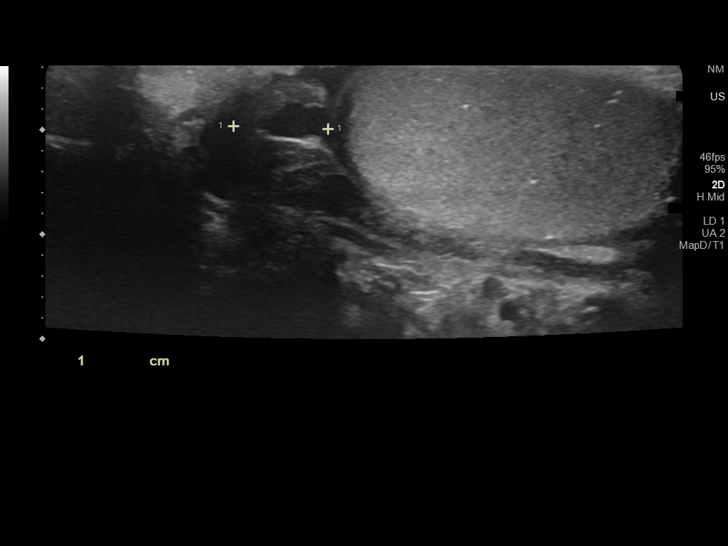
[im 49/54]
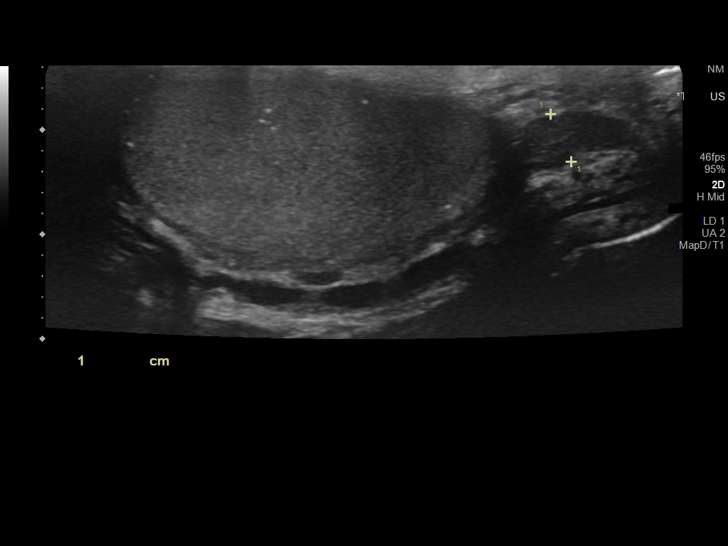
[im 54/54]
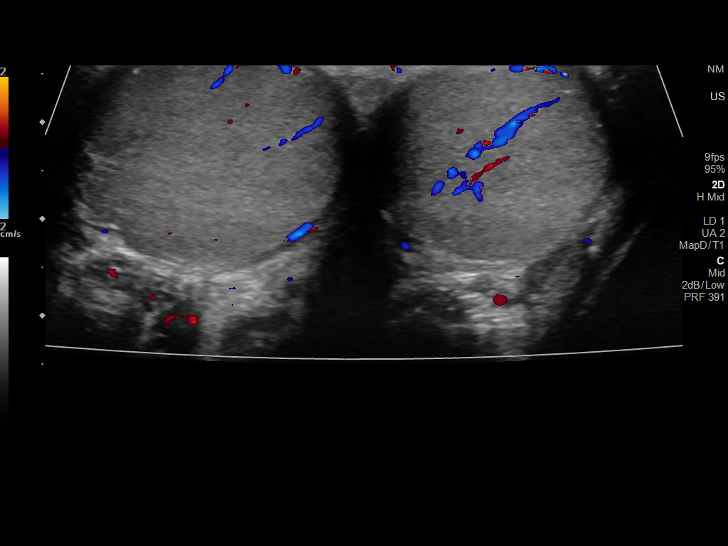

[13 of 25 positions shown; findings below may reference images not displayed]

FINDINGS: Right testicle

Measurements: 2.7 x 4.7 x 2.1 cm. No testicular mass. There are
scattered punctate calcifications primarily along the periphery of
this testicle nonspecific.

Left testicle

Measurements: 2.7 x 4.5 x 2.2 cm. No testicular mass. There is
scattered punctate calcifications primarily along the periphery of
this testicle, nonspecific.

Right epididymis:  Normal in size and appearance.

Left epididymis: Normal in size and appearance. 3 mm simple
epididymal cyst is identified, of doubtful significance.

Hydrocele:  None visualized.

Varicocele:  None visualized.

Pulsed Doppler interrogation of both testes demonstrates normal low
resistance arterial and venous waveforms bilaterally.
IMPRESSION: 1. No evidence of testicular torsion.
2. Punctate calcifications primarily in the subcapsular regions of
the bilateral testicles, nonspecific. This is not the usual
distribution for testicular microlithiasis, it may be related to
prior inflammation or infection.

## 2020-12-01 ENCOUNTER — Emergency Department (HOSPITAL_COMMUNITY)
Admission: EM | Admit: 2020-12-01 | Discharge: 2020-12-02 | Disposition: A | Discharge status: Home / Self Care | Payer: Self-pay | Attending: Emergency Medicine | Admitting: Emergency Medicine

## 2020-12-01 ENCOUNTER — Encounter (HOSPITAL_COMMUNITY): Payer: Self-pay | Admitting: Emergency Medicine

## 2020-12-01 ENCOUNTER — Other Ambulatory Visit: Payer: Self-pay

## 2020-12-01 DIAGNOSIS — J029 Acute pharyngitis, unspecified: Secondary | ICD-10-CM | POA: Insufficient documentation

## 2020-12-01 DIAGNOSIS — Z79899 Other long term (current) drug therapy: Secondary | ICD-10-CM | POA: Insufficient documentation

## 2020-12-01 DIAGNOSIS — R21 Rash and other nonspecific skin eruption: Secondary | ICD-10-CM | POA: Insufficient documentation

## 2020-12-01 DIAGNOSIS — F17210 Nicotine dependence, cigarettes, uncomplicated: Secondary | ICD-10-CM | POA: Insufficient documentation

## 2020-12-01 DIAGNOSIS — I10 Essential (primary) hypertension: Secondary | ICD-10-CM | POA: Insufficient documentation

## 2020-12-01 DIAGNOSIS — R030 Elevated blood-pressure reading, without diagnosis of hypertension: Secondary | ICD-10-CM

## 2020-12-01 NOTE — ED Triage Notes (Signed)
Patient reports sore throat with mild swelling for several days , denies fever or chills , no cough or congestion , patient also requesting Hepatitis blood test , hypertensive at triage .

## 2020-12-02 LAB — GROUP A STREP BY PCR: Group A Strep by PCR: NOT DETECTED

## 2020-12-02 LAB — MONONUCLEOSIS SCREEN: Mono Screen: NEGATIVE

## 2020-12-02 MED ORDER — HYDROXYZINE HCL 25 MG PO TABS: 25.0000 mg | ORAL_TABLET | Freq: Four times a day (QID) | ORAL | 0 refills | Status: AC

## 2020-12-02 MED ORDER — TRIAMCINOLONE ACETONIDE 0.1 % EX CREA: 1.0000 "application " | TOPICAL_CREAM | Freq: Two times a day (BID) | CUTANEOUS | 0 refills | Status: AC

## 2020-12-02 MED ORDER — HYDROXYZINE HCL 25 MG PO TABS
25.0000 mg | ORAL_TABLET | Freq: Once | ORAL | Status: AC
  Administered 2020-12-02: 25 mg via ORAL
  Filled 2020-12-02: qty 1

## 2020-12-02 NOTE — ED Provider Notes (Signed)
[facility] EMERGENCY DEPARTMENT Provider Note   CSN: [id number] Arrival date & time: 12/01/20  1844     History Chief Complaint  Patient presents with  . Sore Throat    Hypertensive    Finch Costanzo is a 50 y.o. male with a history of hypertension who presents to the emergency department with a chief complaint of sore throat.  The patient reports that he has had a persistent sore throat for the last few weeks.  Pain has not been worsening, but over the last 2 to 3 days the patient developed a pruritic rash to his scalp and bilateral arms, which prompted his visit to the ER.  No history of similar rash.  No known sick contacts.  No treatment for rash or sore throat prior to arrival.  He does not feel as if the rash has been spreading, but reports that it is not improved.  He states that he feels his lymph node are swollen on his neck, but denies recent fevers, chills, neck stiffness, headache, visual changes, dental pain, otalgia, nasal congestion, rhinorrhea, cough, shortness of breath, chest pain, joint pain, dysphagia, odynophagia, drooling.  He is also requesting hepatitis C testing.  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  He reports that he does occasionally snort heroin, but denies any IV drug use.  He denies any recent heroin use.  He also reports that he is not currently sexually active, but has previously been sexually active with male partners.  He denies any recent penile discharge or penile or testicular pain or swelling.  No abdominal pain, nausea, vomiting, diarrhea.  No new soaps, lotions, detergents, clothing, sleeping environments, or travel.  No history of similar rash.  The history is provided by the patient and medical records. No language interpreter was used.       Past Medical History:  Diagnosis Date  . Hypertension     There are no problems to display for this  patient.   History reviewed. No pertinent surgical history.     No family history on file.  Social History   Tobacco Use  . Smoking status: Current Some Day Smoker    Types: Cigarettes  . Smokeless tobacco: Never Used  Vaping Use  . Vaping Use: Never used  Substance Use Topics  . Alcohol use: Not Currently  . Drug use: Yes    Types: Marijuana    Home Medications Prior to Admission medications   Medication Sig Start Date End Date Taking? Authorizing Provider  hydrOXYzine (ATARAX/VISTARIL) 25 MG tablet Take 1 tablet (25 mg total) by mouth every 6 (six) hours. 12/02/20  Yes Azarria Balint A, PA-C  triamcinolone (KENALOG) 0.1 % Apply 1 application topically 2 (two) times daily. 12/02/20  Yes Latoia Eyster A, PA-C  benzonatate (TESSALON) 100 MG capsule Take 1 capsule (100 mg total) by mouth every 8 (eight) hours. 05/23/19   Joy, Shawn C, PA-C  lidocaine (XYLOCAINE) 2 % solution Use as directed 15 mLs in the mouth or throat as needed for mouth pain. 05/23/19   Joy, Shawn C, PA-C  lisinopril (ZESTRIL) 10 MG tablet Take 1 tablet (10 mg total) by mouth daily. 05/23/19 07/22/19  Joy, Shawn C, PA-C  predniSONE (STERAPRED UNI-PAK 21 TAB) 10 MG (21) TBPK tablet Take 6 tabs (60mg ) day 1, 5 tabs (50mg ) day 2, 4 tabs (40mg ) day 3, 3 tabs (30mg ) day 4, 2 tabs (20mg ) day 5, and 1  tab (10mg ) day 6. 05/23/19   Joy, Shawn C, PA-C    Allergies    Patient has no known allergies.  Review of Systems   Review of Systems  Constitutional: Negative for appetite change, chills, diaphoresis and fever.  HENT: Positive for sore throat. Negative for congestion, mouth sores, sinus pressure, sinus pain, tinnitus and voice change.   Respiratory: Negative for cough, shortness of breath and wheezing.   Cardiovascular: Negative for chest pain.  Gastrointestinal: Negative for abdominal pain, constipation, diarrhea, nausea and vomiting.  Genitourinary: Negative for dysuria and penile discharge.  Musculoskeletal:  Negative for back pain, myalgias, neck pain and neck stiffness.  Skin: Positive for rash. Negative for wound.  Allergic/Immunologic: Negative for immunocompromised state.  Neurological: Negative for dizziness, seizures, syncope, weakness, numbness and headaches.  Psychiatric/Behavioral: Negative for confusion.    Physical Exam Updated Vital Signs BP (!) 164/98 (BP Location: Right Arm)   Pulse 97   Temp 97.9 F (36.6 C) (Oral)   Resp 18   Ht 6' (1.829 m)   Wt 100 kg   SpO2 98%   BMI 29.90 kg/m   Physical Exam Vitals and nursing note reviewed.  Constitutional:      General: He is not in acute distress.    Appearance: He is well-developed. He is not ill-appearing, toxic-appearing or diaphoretic.  HENT:     Head: Normocephalic.     Mouth/Throat:     Mouth: Mucous membranes are moist.     Pharynx: Uvula midline. Pharyngeal swelling and posterior oropharyngeal erythema present. No uvula swelling.     Tonsils: No tonsillar exudate or tonsillar abscesses.     Comments: 2+ tonsillar edema bilaterally.  No exudate.  Uvula is midline.  No drooling or trismus.  No sublingual edema. Eyes:     Conjunctiva/sclera: Conjunctivae normal.  Neck:     Comments: Minimal cervical lymphadenopathy bilaterally. Cardiovascular:     Rate and Rhythm: Normal rate and regular rhythm.     Pulses: Normal pulses.     Heart sounds: Normal heart sounds. No murmur heard. No friction rub. No gallop.   Pulmonary:     Effort: Pulmonary effort is normal. No respiratory distress.     Breath sounds: No stridor. No wheezing, rhonchi or rales.  Chest:     Chest wall: No tenderness.  Abdominal:     General: There is no distension.     Palpations: Abdomen is soft. There is no mass.     Tenderness: There is no abdominal tenderness. There is no right CVA tenderness, left CVA tenderness, guarding or rebound.     Hernia: No hernia is present.  Musculoskeletal:     Cervical back: Neck supple.  Skin:    General:  Skin is warm and dry.     Comments: Circular, flesh-colored lesions noted to the bilateral arms, abdomen, and chest wall.  There are some scabbed circular lesions noted to the scalp.  The back and legs are spared.  No fluctuance or induration.  No drainage.  No vesicles.  No petechiae or purpura.  Neurological:     Mental Status: He is alert.  Psychiatric:        Behavior: Behavior normal.     ED Results / Procedures / Treatments   Labs (all labs ordered are listed, but only abnormal results are displayed) Labs Reviewed  GROUP A STREP BY PCR  MONONUCLEOSIS SCREEN  GC/CHLAMYDIA PROBE AMP ([place]) NOT AT [facility]    EKG None  Radiology No  results found.  Procedures Procedures   Medications Ordered in ED Medications  hydrOXYzine (ATARAX/VISTARIL) tablet 25 mg (25 mg Oral Given 12/02/20 0348)    ED Course  I have reviewed the triage vital signs and the nursing notes.  Pertinent labs & imaging results that were available during my care of the patient were reviewed by me and considered in my medical decision making (see chart for details).    MDM Rules/Calculators/A&P                          50 year old male with a history of hypertension who presents the emergency department with a chief complaint of sore throat for the last 3 weeks accompanied by a pruritic rash to the arms, scalp, and trunk for the last 3 days.  Patient is also requesting hepatitis C testing after he had a heroin overdose in December and was "revived" with a dirty needle.  He denies IV drug use and has no history of IV drug use.  Blood pressure is elevated, vital signs are otherwise unremarkable.  The patient was seen independently evaluated by Dr. Preston Fleeting, attending physician.  On exam, bilateral tonsils are 2+, erythematous.  Uvula is midline.  There is no drooling or sublingual edema.  No trismus.  Labs and imaging of been reviewed and independently interpreted by me. Strep PCR is negative.   Mononucleosis testing is negative.  GC chlamydia testing is pending.  I have a low suspicion for disseminated gonorrhea.  Rash does not appear consistent with viral exanthem or with sandpaper streptococcal rash.  Could consider scabies, but appears more consistent with contact or irritant dermatitis.  Will discharge with Kenalog and hydroxyzine for symptoms.  Regarding pharyngitis, patient's had numerous ER visits in the past for pharyngitis.  His blood pressure was also incidentally elevated, but he was asymptomatic from this at this time.  We will provide the patient with a referral to [facility] health and wellness for follow-up regarding his blood pressure as well as his sore throat.  Doubt peritonsillar abscess, retropharyngeal abscess, uvulitis, epiglottitis, abscess, cellulitis, folliculitis.  The patient also has a follow-up with infectious disease clinic today.  He has follow-up for pending GC chlamydia testing.  All questions answered.  He is hemodynamically stable no acute distress.  Safe for discharge home with outpatient follow-up as indicated.  Final Clinical Impression(s) / ED Diagnoses Final diagnoses:  Pharyngitis, unspecified etiology  Rash and nonspecific skin eruption  Elevated blood pressure reading    Rx / DC Orders ED Discharge Orders         Ordered    triamcinolone (KENALOG) 0.1 %  2 times daily        12/02/20 0615    hydrOXYzine (ATARAX/VISTARIL) 25 MG tablet  Every 6 hours        12/02/20 0615           Anaijah Augsburger, Pedro Earls A, PA-C 12/02/20 3299    Dione Booze, MD 12/02/20 [phone]

## 2020-12-02 NOTE — Discharge Instructions (Addendum)
Thank you for allowing me to care for you today in the Emergency Department.   Follow-up with infectious disease and primary care as described above.  Your gonorrhea and Chlamydia swab is pending.  You can follow-up with this in the outpatient setting.  You should also receive a call from the hospital if these test are positive.  Please make sure the primary care recheck your blood pressure as it was elevated today.  Take hydroxyzine and triamcinolone cream for your rash as prescribed as your last dose was likely allergic in nature.  Return to the emergency department if you develop respiratory distress, if you become unable to swallow, open your mouth, have drooling, have high fevers, if you start having thick, mucus-like drainage from your rash, or other new, concerning symptoms.

## 2022-01-13 ENCOUNTER — Ambulatory Visit (HOSPITAL_COMMUNITY)
Admission: EM | Admit: 2022-01-13 | Discharge: 2022-01-13 | Disposition: A | Discharge status: Home / Self Care | Payer: No Payment, Other | Attending: Psychiatry | Admitting: Psychiatry

## 2022-01-13 DIAGNOSIS — Z9151 Personal history of suicidal behavior: Secondary | ICD-10-CM | POA: Insufficient documentation

## 2022-01-13 DIAGNOSIS — I10 Essential (primary) hypertension: Secondary | ICD-10-CM | POA: Insufficient documentation

## 2022-01-13 DIAGNOSIS — Z046 Encounter for general psychiatric examination, requested by authority: Secondary | ICD-10-CM | POA: Insufficient documentation

## 2022-01-13 DIAGNOSIS — Z5901 Sheltered homelessness: Secondary | ICD-10-CM | POA: Insufficient documentation

## 2022-01-13 NOTE — Discharge Instructions (Signed)
[facility] and Wellness ?[address] ?[place],  [place]  [address] ?Phone- [phone] ?Hours: Mon-Fri 8:30 AM-5:30 PM ? ? ?For further Psychiatric care if needed- ?Please come to [facility] (this facility) during walk in hours for appointment with psychiatrist/provider for further medication management and for therapists for therapy.  ? ? Walk-Ins for medication management  are available on Monday, Wednesday, Thursday and Friday from 8am-11am.  It is first come, first -serve; it is best to arrive by 7:00 AM.  ? ? Walk-Ins for therapy are  available on Monday and Wednesday?s  8am-11am.  It is first come, first -serve; it is best to arrive by 7:00 AM.  ? ? ?When you arrive please go upstairs for your appointment. If you are unsure of where to go, inform the front desk that you are here for a walk in appointment and they will assist you with directions upstairs. ? ?Address:  ?[address] in [place], [address] ?Ph: [phone]  ? ? ?

## 2022-01-13 NOTE — ED Triage Notes (Signed)
Pt states that his probation officer told him to come to [facility] for a mental health evaluation as a requirement for his probation. Pt denies SI/HI an AVH. ?

## 2022-01-13 NOTE — ED Provider Notes (Signed)
Behavioral Health Urgent Care Medical Screening Exam ? ?Patient Name: Jesus Thomas ?MRN: [id number] ?Date of Evaluation: 01/13/22 ?Chief Complaint:   ?Diagnosis:  ?Final diagnoses:  ?None  ? ? ?History of Present illness: Jesus Thomas is a 51 y.o. male. He presents for evaluation today due to requirement by his probation officer for psychiatric evaluation.  ? ?He reports past psychiatric history of depression.  He reports that he attempted suicide once in 1999 via OD on pills.  He reports being seen at [facility] many years ago and being prescribed medication but never taking it.  He reports remembering Wellbutrin but cannot remember any other names.  He reports no history of inpatient hospitalizations.  He reports no history of self-injurious behaviors.  He reports no known family history of Diagnosis', Substance Use, or Suicides.  He reports a medical history of hypertension, not currently on medications.  He reports no history of head trauma or seizures. ? ?He reports currently living in a shelter for about 1 year.  He reports currently looking for work.  He reports no EtOH, tobacco use, or substance use.  He reports past Crack Cocaine use, last use 5 yrs ago.  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  He reports currently being on Probation and 2 upcoming court dates.  He reports no access to fire arms.   ? ?He reports no symptoms of Depression, Mania, Psychosis, or history of abuse. ? ?He reports he is interested in establishing with a PCP because he wants to restart blood pressure medication.  Discussed providing information for [facility] and Wellness.  No SI, HI, or AVH. No Paranoia, Ideas of Reference, or First Rank symptoms.  He reports no other concerns at present. ? ? ?Psychiatric Specialty Exam ? ?Presentation  ?General Appearance:Appropriate for Environment; Casual ? ?Eye Contact:Good ? ?Speech:Clear and Coherent; Normal Rate ? ?Speech Volume:Normal ? ?Handedness:Right ? ? ?Mood  and Affect  ?Mood:Euthymic ? ?Affect:Appropriate; Congruent ? ? ?Thought Process  ?Thought Processes:Coherent; Goal Directed ? ?Descriptions of Associations:Intact ? ?Orientation:Full (Time, Place and Person) ? ?Thought Content:Logical ?No SI, HI, or AVH. No Paranoia, Ideas of Reference, or First Rank symptoms. ?  ?Hallucinations:None ? ?Ideas of Reference:None ? ?Suicidal Thoughts:No ? ?Homicidal Thoughts:No ? ? ?Sensorium  ?Memory:Immediate Good; Recent Good ? ?Judgment:Good ? ?Insight:Good ? ? ?Executive Functions  ?Concentration:Good ? ?Attention Span:Good ? ?Recall:Good ? ?Fund of Knowledge:Good ? ?Language:Good ? ? ?Psychomotor Activity  ?Psychomotor Activity:Normal ? ? ?Assets  ?Assets:Communication Skills; Physical Health; Resilience ? ? ?Sleep  ?Sleep:Fair ? ?Number of hours: No data recorded ? ?No data recorded ? ?Physical Exam: ?Physical Exam ?Vitals and nursing note reviewed.  ?Constitutional:   ?   General: He is not in acute distress. ?   Appearance: Normal appearance. He is normal weight. He is not ill-appearing or toxic-appearing.  ?HENT:  ?   Head: Normocephalic and atraumatic.  ?Pulmonary:  ?   Effort: Pulmonary effort is normal.  ?Musculoskeletal:     ?   General: Normal range of motion.  ?Neurological:  ?   General: No focal deficit present.  ?   Mental Status: He is alert.  ? ?Review of Systems  ?Respiratory:  Negative for cough and shortness of breath.   ?Cardiovascular:  Negative for chest pain.  ?Gastrointestinal:  Negative for abdominal pain, constipation, diarrhea, nausea and vomiting.  ?Neurological:  Negative for dizziness, weakness and headaches.  ?Psychiatric/Behavioral:  Negative for depression, hallucinations, substance abuse and suicidal ideas. The patient is not  nervous/anxious.   ?Blood pressure (!) 163/107, pulse 75, temperature 98.7 ?F (37.1 ?C), temperature source Oral, resp. rate 18, SpO2 100 %. There is no height or weight on file to calculate  BMI. ? ?Musculoskeletal: ?Strength & Muscle Tone: within normal limits ?Gait & Station: normal ?Patient leans: N/A ? ? ?[facility] MSE Discharge Disposition for Follow up and Recommendations: ?Based on my evaluation the patient does not appear to have an emergency medical condition and can be discharged.  He has been provided with information for [facility] and Wellness and walk in hours for [facility] if needed. ? ? ?Lauro Franklin, MD ?01/13/2022, 3:27 PM ? ?

## 2022-02-02 ENCOUNTER — Telehealth (HOSPITAL_COMMUNITY): Payer: Self-pay | Admitting: Emergency Medicine

## 2022-02-02 NOTE — BH Assessment (Signed)
Care Management - [facility] Follow Up Discharges  ° °Writer attempted to make contact with patient today and was unsuccessful.  Writer left a HIPPA compliant voice message.  ° °Per chart review, patient was provided with outpatient resources. ° °
